# Patient Record
Sex: Male | Born: 1955 | Race: Black or African American | Hispanic: No | Marital: Married | Smoking: Former smoker
Health system: Southern US, Community
[De-identification: ages and names within clinical notes are randomized; demographics above are authoritative.]

## PROBLEM LIST (undated history)

## (undated) DIAGNOSIS — G8929 Other chronic pain: Secondary | ICD-10-CM

## (undated) DIAGNOSIS — M109 Gout, unspecified: Secondary | ICD-10-CM

## (undated) DIAGNOSIS — E7800 Pure hypercholesterolemia, unspecified: Secondary | ICD-10-CM

## (undated) HISTORY — PX: KNEE SURGERY: SHX244

---

## 1997-12-17 ENCOUNTER — Emergency Department (HOSPITAL_COMMUNITY): Admission: EM | Admit: 1997-12-17 | Discharge: 1997-12-17 | Payer: Self-pay | Admitting: *Deleted

## 2007-04-15 ENCOUNTER — Ambulatory Visit (HOSPITAL_COMMUNITY): Admission: RE | Admit: 2007-04-15 | Discharge: 2007-04-15 | Payer: Self-pay | Admitting: Family Medicine

## 2007-04-19 ENCOUNTER — Encounter: Admission: RE | Admit: 2007-04-19 | Discharge: 2007-04-19 | Payer: Self-pay | Admitting: Family Medicine

## 2013-10-18 ENCOUNTER — Emergency Department (HOSPITAL_COMMUNITY): Payer: No Typology Code available for payment source

## 2013-10-18 ENCOUNTER — Emergency Department (HOSPITAL_COMMUNITY)
Admission: EM | Admit: 2013-10-18 | Discharge: 2013-10-19 | Disposition: A | Discharge status: Home / Self Care | Payer: No Typology Code available for payment source | Attending: Emergency Medicine | Admitting: Emergency Medicine

## 2013-10-18 ENCOUNTER — Encounter (HOSPITAL_COMMUNITY): Payer: Self-pay | Admitting: Emergency Medicine

## 2013-10-18 DIAGNOSIS — S4980XA Other specified injuries of shoulder and upper arm, unspecified arm, initial encounter: Secondary | ICD-10-CM | POA: Insufficient documentation

## 2013-10-18 DIAGNOSIS — S46909A Unspecified injury of unspecified muscle, fascia and tendon at shoulder and upper arm level, unspecified arm, initial encounter: Secondary | ICD-10-CM | POA: Insufficient documentation

## 2013-10-18 DIAGNOSIS — S79919A Unspecified injury of unspecified hip, initial encounter: Secondary | ICD-10-CM | POA: Insufficient documentation

## 2013-10-18 DIAGNOSIS — Z8639 Personal history of other endocrine, nutritional and metabolic disease: Secondary | ICD-10-CM | POA: Insufficient documentation

## 2013-10-18 DIAGNOSIS — Y9389 Activity, other specified: Secondary | ICD-10-CM | POA: Insufficient documentation

## 2013-10-18 DIAGNOSIS — S199XXA Unspecified injury of neck, initial encounter: Secondary | ICD-10-CM

## 2013-10-18 DIAGNOSIS — G8929 Other chronic pain: Secondary | ICD-10-CM | POA: Insufficient documentation

## 2013-10-18 DIAGNOSIS — Y92410 Unspecified street and highway as the place of occurrence of the external cause: Secondary | ICD-10-CM | POA: Insufficient documentation

## 2013-10-18 DIAGNOSIS — S79929A Unspecified injury of unspecified thigh, initial encounter: Secondary | ICD-10-CM

## 2013-10-18 DIAGNOSIS — Z87891 Personal history of nicotine dependence: Secondary | ICD-10-CM | POA: Insufficient documentation

## 2013-10-18 DIAGNOSIS — Z862 Personal history of diseases of the blood and blood-forming organs and certain disorders involving the immune mechanism: Secondary | ICD-10-CM | POA: Insufficient documentation

## 2013-10-18 DIAGNOSIS — S0993XA Unspecified injury of face, initial encounter: Secondary | ICD-10-CM | POA: Insufficient documentation

## 2013-10-18 DIAGNOSIS — IMO0002 Reserved for concepts with insufficient information to code with codable children: Secondary | ICD-10-CM | POA: Insufficient documentation

## 2013-10-18 DIAGNOSIS — S61409A Unspecified open wound of unspecified hand, initial encounter: Secondary | ICD-10-CM | POA: Insufficient documentation

## 2013-10-18 HISTORY — DX: Gout, unspecified: M10.9

## 2013-10-18 HISTORY — DX: Other chronic pain: G89.29

## 2013-10-18 HISTORY — DX: Pure hypercholesterolemia, unspecified: E78.00

## 2013-10-18 MED ORDER — IBUPROFEN 800 MG PO TABS: 800.0000 mg | ORAL_TABLET | Freq: Three times a day (TID) | ORAL | Status: AC
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
## 2021-10-05 ENCOUNTER — Other Ambulatory Visit: Payer: Self-pay | Admitting: Physical Medicine and Rehabilitation

## 2021-10-05 DIAGNOSIS — M5416 Radiculopathy, lumbar region: Secondary | ICD-10-CM

## 2021-10-12 ENCOUNTER — Ambulatory Visit
Admission: RE | Admit: 2021-10-12 | Discharge: 2021-10-12 | Disposition: A | Discharge status: Home / Self Care | Payer: Non-veteran care | Source: Ambulatory Visit | Attending: Physical Medicine and Rehabilitation | Admitting: Physical Medicine and Rehabilitation

## 2021-10-12 DIAGNOSIS — M5416 Radiculopathy, lumbar region: Secondary | ICD-10-CM

## 2021-10-12 MED ORDER — DIAZEPAM 5 MG PO TABS
5.0000 mg | ORAL_TABLET | Freq: Once | ORAL | Status: AC
  Administered 2021-10-12: 5 mg via ORAL

## 2021-10-12 MED ORDER — MEPERIDINE HCL 50 MG/ML IJ SOLN: 50.0000 mg | Freq: Once | INTRAMUSCULAR | Status: DC | PRN

## 2021-10-12 MED ORDER — ONDANSETRON HCL 4 MG/2ML IJ SOLN: 4.0000 mg | Freq: Once | INTRAMUSCULAR | Status: DC | PRN

## 2021-10-12 MED ORDER — IOPAMIDOL (ISOVUE-M 200) INJECTION 41%
20.0000 mL | Freq: Once | INTRAMUSCULAR | Status: AC
  Administered 2021-10-12: 20 mL via INTRATHECAL

## 2021-10-12 NOTE — Discharge Instructions (Signed)
Myelogram Discharge Instructions ? ?Go home and rest quietly as needed. You may resume normal activities; however, do not exert yourself strongly or do any heavy lifting today and tomorrow.  ? ?DO NOT drive today.   ? ?You may resume your normal diet and medications unless otherwise indicated. Drink lots of extra fluids today and tomorrow.  ? ?The incidence of headache, nausea, or vomiting is about 5% (one in 20 patients).  If you develop a headache, lie flat for 24 hours and drink plenty of fluids until the headache goes away.  Caffeinated beverages may be helpful. If when you get up you still have a headache when standing, go back to bed and force fluids for another 24 hours.  ? ?If you develop severe nausea and vomiting or a headache that does not go away with the flat bedrest after 48 hours, please call [phone].  ? ?Call your physician for a follow-up appointment.  The results of your myelogram will be sent directly to your physician by the following day. ? ?If you have any questions or if complications develop after you arrive home, please call [phone]. ? ?Discharge instructions have been explained to the patient.  The patient, or the person responsible for the patient, fully understands these instructions.  ? ?Thank you for visiting our office today. ? ?MAY RESUME ASPIRIN IMMEDIATELY AFTER PROCEDURE! ?   ?

## 2022-09-22 IMAGING — XA DG MYELOGRAPHY LUMBAR INJ LUMBOSACRAL
13 series · 13 of 13 positions shown · IV contrast (omnipaque)
Comparison: None

CLINICAL DATA: Low back and bilateral lower extremity pain. No
previous lumbar surgery.

EXAM:
LUMBAR MYELOGRAM
CT LUMBAR SPINE WITH INTRATHECAL CONTRAST
FLUOROSCOPY:
Radiation Exposure Index (as provided by the fluoroscopic device):
12.4 mGy air Kerma
TECHNIQUE: The procedure, risks (including but not limited to bleeding,
infection, organ damage ), benefits, and alternatives were explained
to the patient. Questions regarding the procedure were encouraged
and answered. The patient understands and consents to the procedure.
An appropriate entry site was determined under fluoroscopy. Operator
donned sterile gloves and mask. Skin site was marked, prepped with
Betadine, and draped in usual sterile fashion, and infiltrated
locally with 1% lidocaine. A 22 gauge spinal needle was advanced
into the thecal sac at L3 from a right interlaminar approach. Clear
colorless CSF returned. 17 ml Omnipaque 180 were administered
intrathecally for lumbar myelography, followed by axial CT scanning
of the lumbar spine. I personally performed the lumbar puncture and
administered the intrathecal contrast. I also personally supervised
acquisition of the myelogram images. Coronal and sagittal
reconstructions were generated from the axial scan. This exam was
performed according to the departmental dose-optimization program
which includes automated exposure control, adjustment of the mA
and/or kV according to patient size and/or use of iterative
reconstruction technique.

[Series 1: vasc adipose · 1 of 1 slices shown (1 of 13)]
[im 1/1]
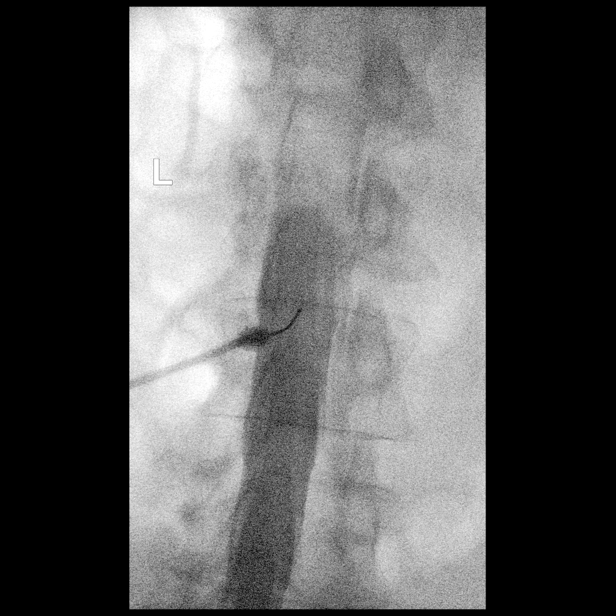

[Series 2: vasc adipose · 1 of 1 slices shown (2 of 13)]
[im 1/1]
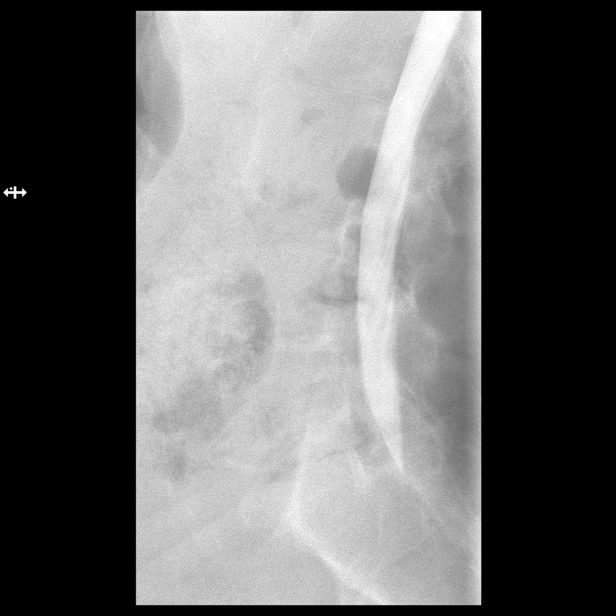

[Series 3: vasc adipose · 1 of 1 slices shown (3 of 13)]
[im 1/1]
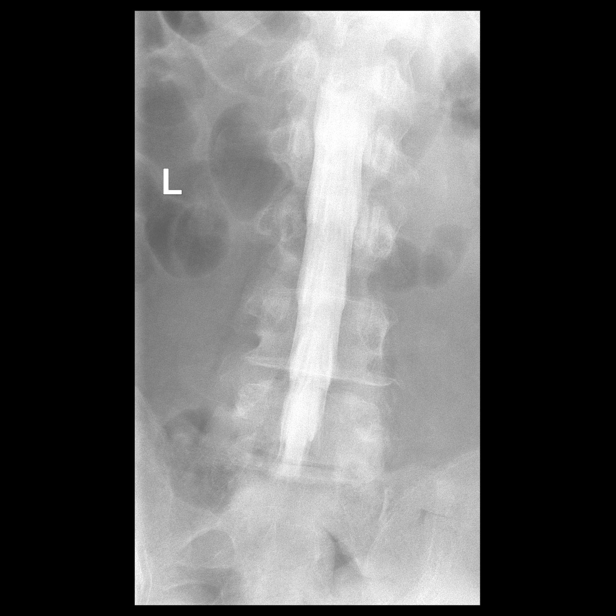

[Series 4: vasc adipose · 1 of 1 slices shown (4 of 13)]
[im 1/1]
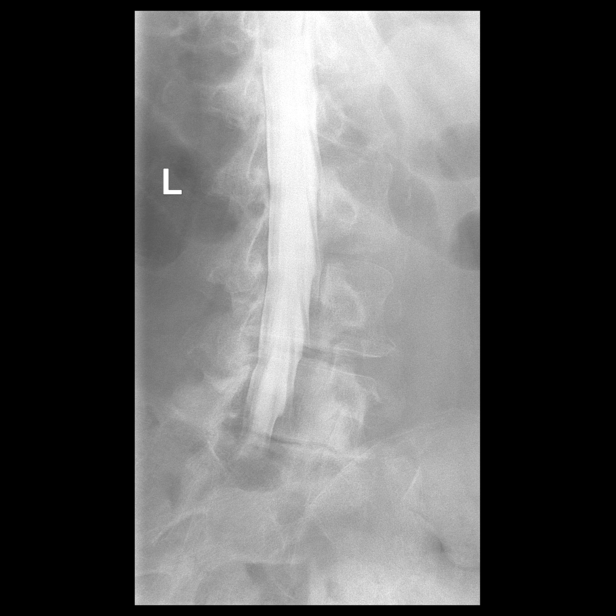

[Series 5: vasc adipose · 1 of 1 slices shown (5 of 13)]
[im 1/1]
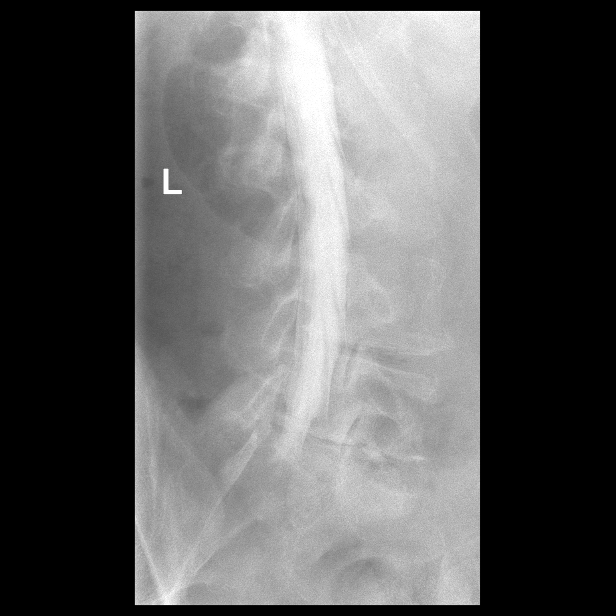

[Series 6: vasc adipose · 1 of 1 slices shown (6 of 13)]
[im 1/1]
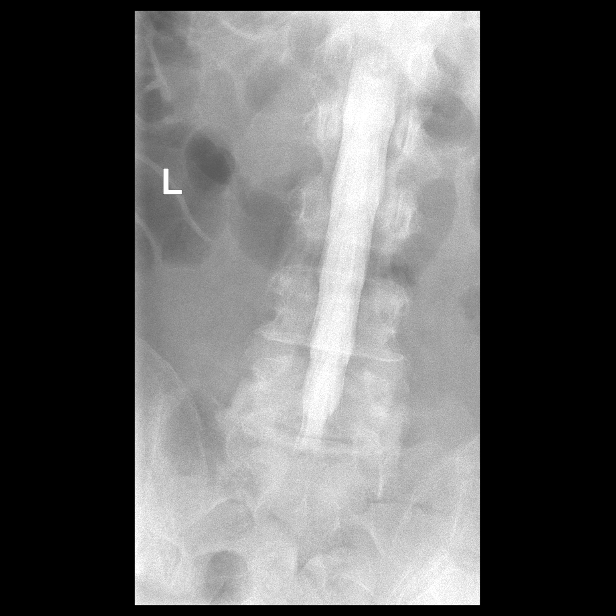

[Series 7: vasc adipose · 1 of 1 slices shown (7 of 13)]
[im 1/1]
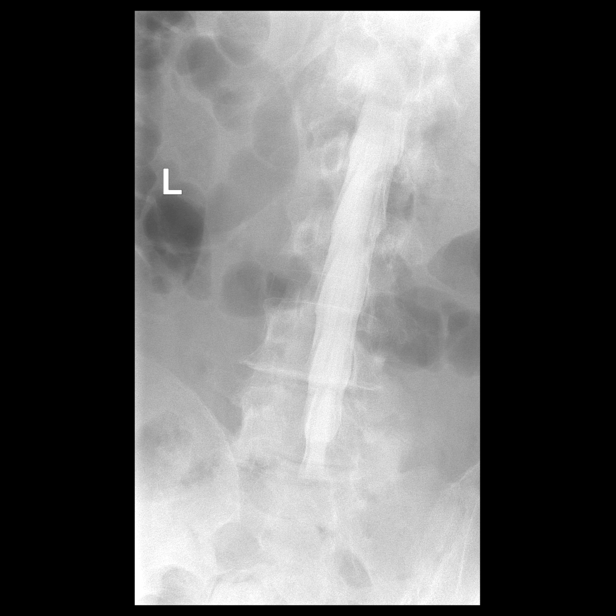

[Series 8: vasc adipose · 1 of 1 slices shown (8 of 13)]
[im 1/1]
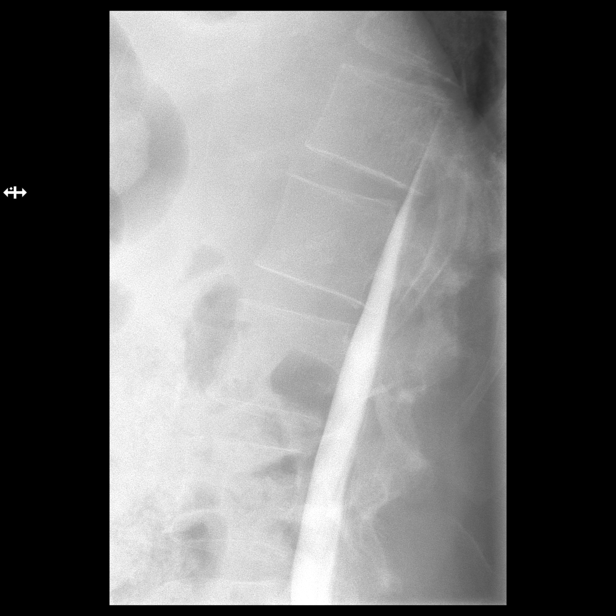

[Series 9: vasc adipose · 1 of 1 slices shown (9 of 13)]
[im 1/1]
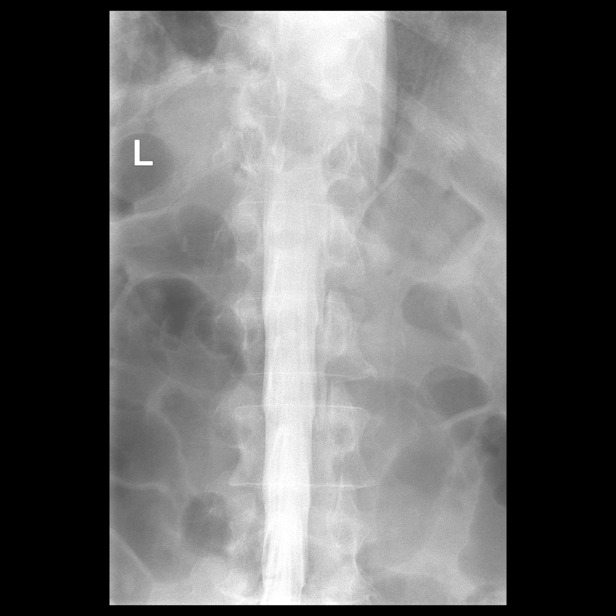

[Series 10: vasc adipose · 1 of 1 slices shown (10 of 13)]
[im 1/1]
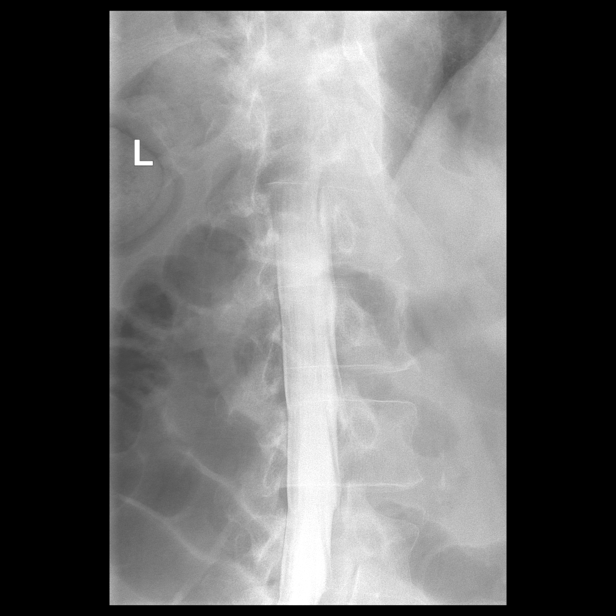

[Series 11: vasc adipose · 1 of 1 slices shown (11 of 13)]
[im 1/1]
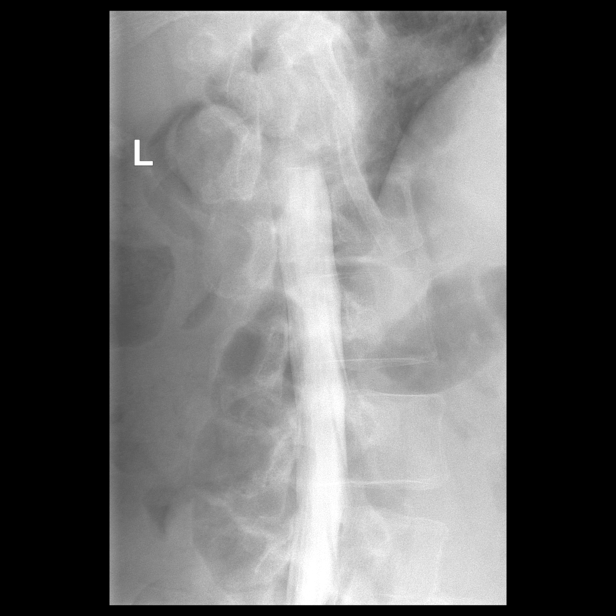

[Series 12: vasc adipose · 1 of 1 slices shown (12 of 13)]
[im 1/1]
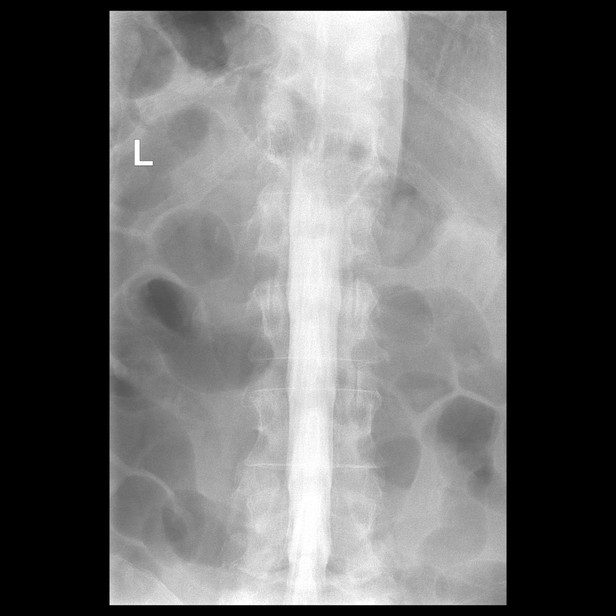

[Series 13: vasc adipose · 1 of 1 slices shown (13 of 13)]
[im 1/1]
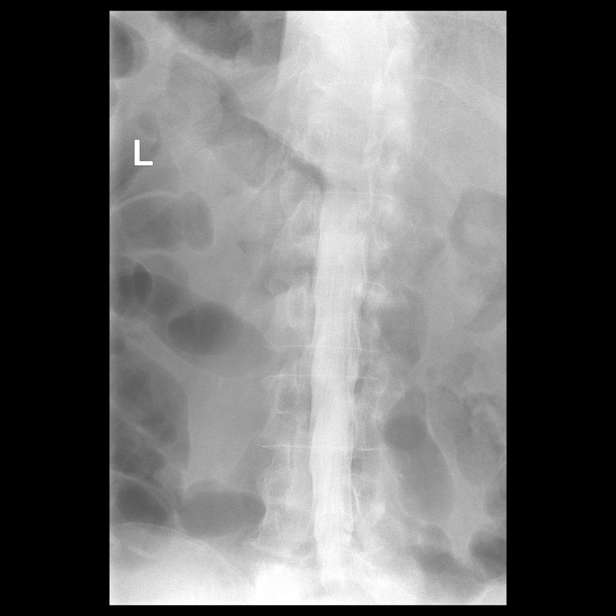

[13 of 13 positions shown; findings below may reference images not displayed]

FINDINGS: 5 non rib-bearing lumbar segments assigned L1-L5. Normal alignment.
Negative for fracture. Benign hemangioma in the T12 vertebral body.

T12-L1: Interspace unremarkable. No spinal or foraminal stenosis.
Conus terminates behind L1.

L1-2:  Interspace unremarkable.  No spinal or foraminal stenosis.

L2-3:  Interspace unremarkable.  No spinal or foraminal stenosis.

L3-4:  Mild disc bulge.  No spinal or foraminal stenosis.

L4-5: Narrowing of the interspace with vacuum phenomenon. Broad left
paracentral disc bulge. No spinal or foraminal stenosis.

L5-S1: Narrowing of the interspace with vacuum phenomenon. Small
central protrusion. Early bilateral facet DJD with foraminal
encroachment right greater than left. No spinal stenosis.

Moderate aortoiliac calcified atheromatous plaque. Remainder
visualized paraspinal soft tissues unremarkable.
IMPRESSION: 1. Disc bulges L3-4 and L4-5 without compressive pathology.
2. Central protrusion L5-S1 and bilateral facet DJD, with foraminal
encroachment right greater than left.
3.  Aortic Atherosclerosis (DIQK9-170.0).

## 2022-09-22 IMAGING — CT CT L SPINE W/ CM
1 of 8 series · 5 of 14 positions shown, 7 images · IV contrast (omnipaque)
Comparison: None

CLINICAL DATA: Low back and bilateral lower extremity pain. No
previous lumbar surgery.

EXAM:
LUMBAR MYELOGRAM
CT LUMBAR SPINE WITH INTRATHECAL CONTRAST
FLUOROSCOPY:
Radiation Exposure Index (as provided by the fluoroscopic device):
12.4 mGy air Kerma
TECHNIQUE: The procedure, risks (including but not limited to bleeding,
infection, organ damage ), benefits, and alternatives were explained
to the patient. Questions regarding the procedure were encouraged
and answered. The patient understands and consents to the procedure.
An appropriate entry site was determined under fluoroscopy. Operator
donned sterile gloves and mask. Skin site was marked, prepped with
Betadine, and draped in usual sterile fashion, and infiltrated
locally with 1% lidocaine. A 22 gauge spinal needle was advanced
into the thecal sac at L3 from a right interlaminar approach. Clear
colorless CSF returned. 17 ml Omnipaque 180 were administered
intrathecally for lumbar myelography, followed by axial CT scanning
of the lumbar spine. I personally performed the lumbar puncture and
administered the intrathecal contrast. I also personally supervised
acquisition of the myelogram images. Coronal and sagittal
reconstructions were generated from the axial scan. This exam was
performed according to the departmental dose-optimization program
which includes automated exposure control, adjustment of the mA
and/or kV according to patient size and/or use of iterative
reconstruction technique.

[Series 3: l spine soft · axial · 0.30mm/px · z∈[-818,-628]mm · 5 of 143 slices shown, 7 images]
[im 24/143  soft-tissue]
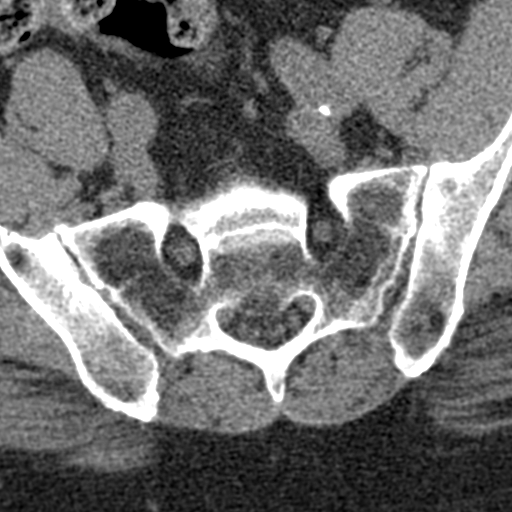
[im 24/143  bone]
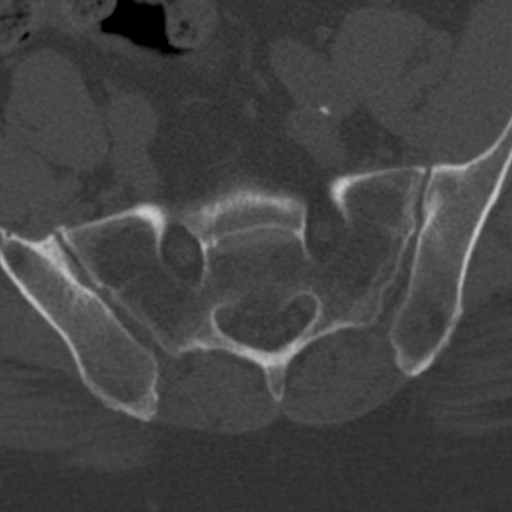
[im 48/143  bone]
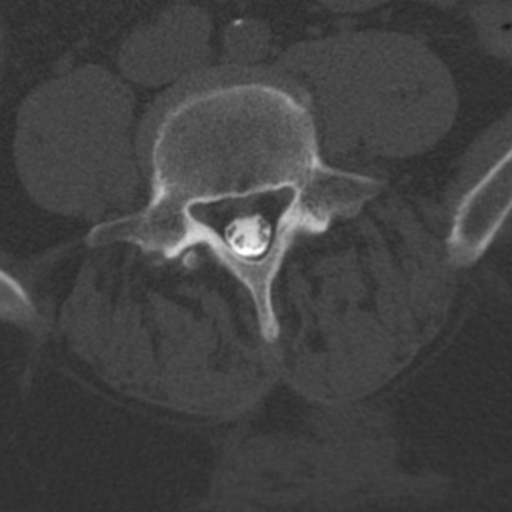
[im 72/143  bone]
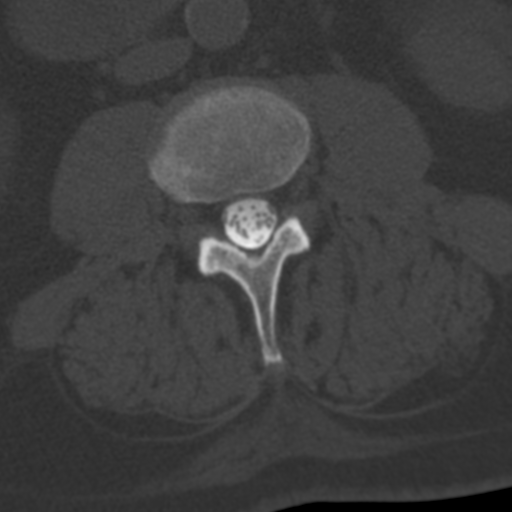
[im 95/143  bone]
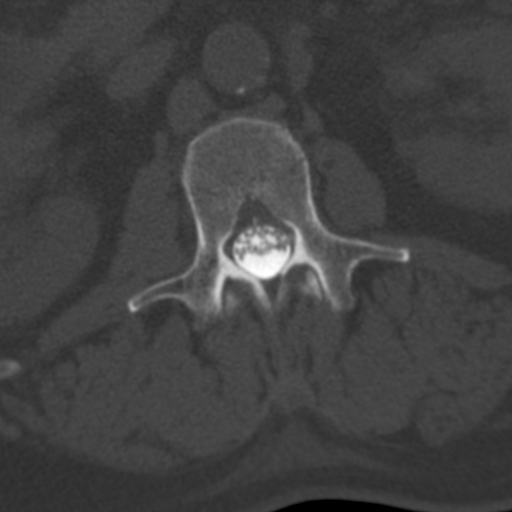
[im 119/143  soft-tissue]
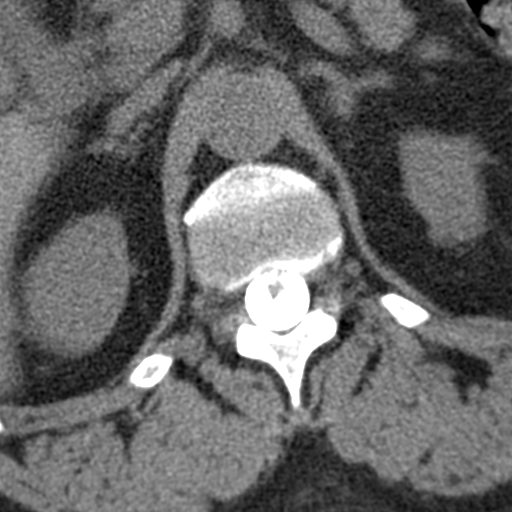
[im 119/143  bone]
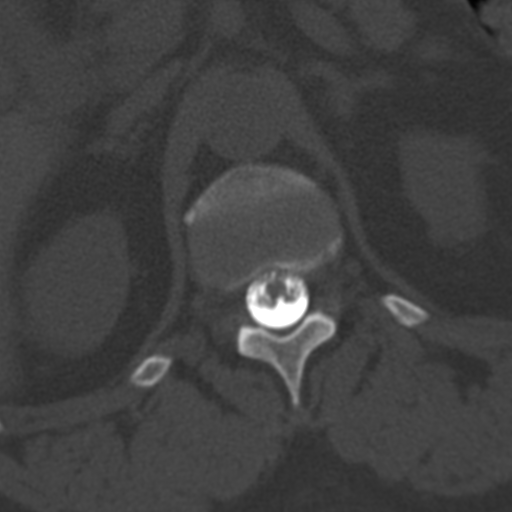

[5 of 14 positions shown; findings below may reference images not displayed]

FINDINGS: 5 non rib-bearing lumbar segments assigned L1-L5. Normal alignment.
Negative for fracture. Benign hemangioma in the T12 vertebral body.

T12-L1: Interspace unremarkable. No spinal or foraminal stenosis.
Conus terminates behind L1.

L1-2:  Interspace unremarkable.  No spinal or foraminal stenosis.

L2-3:  Interspace unremarkable.  No spinal or foraminal stenosis.

L3-4:  Mild disc bulge.  No spinal or foraminal stenosis.

L4-5: Narrowing of the interspace with vacuum phenomenon. Broad left
paracentral disc bulge. No spinal or foraminal stenosis.

L5-S1: Narrowing of the interspace with vacuum phenomenon. Small
central protrusion. Early bilateral facet DJD with foraminal
encroachment right greater than left. No spinal stenosis.

Moderate aortoiliac calcified atheromatous plaque. Remainder
visualized paraspinal soft tissues unremarkable.
IMPRESSION: 1. Disc bulges L3-4 and L4-5 without compressive pathology.
2. Central protrusion L5-S1 and bilateral facet DJD, with foraminal
encroachment right greater than left.
3.  Aortic Atherosclerosis (DIQK9-170.0).
